# Patient Record
Sex: Male | Born: 1955 | Race: White | Hispanic: No | Marital: Married
Health system: Southern US, Community
[De-identification: ages and names within clinical notes are randomized; demographics above are authoritative.]

## PROBLEM LIST (undated history)

## (undated) DIAGNOSIS — Z6827 Body mass index (BMI) 27.0-27.9, adult: Secondary | ICD-10-CM

## (undated) DIAGNOSIS — M722 Plantar fascial fibromatosis: Secondary | ICD-10-CM

## (undated) DIAGNOSIS — E785 Hyperlipidemia, unspecified: Secondary | ICD-10-CM

## (undated) DIAGNOSIS — R7303 Prediabetes: Secondary | ICD-10-CM

## (undated) DIAGNOSIS — I4891 Unspecified atrial fibrillation: Secondary | ICD-10-CM

## (undated) DIAGNOSIS — I480 Paroxysmal atrial fibrillation: Secondary | ICD-10-CM

## (undated) HISTORY — DX: Plantar fascial fibromatosis: M72.2

## (undated) HISTORY — DX: Body mass index (BMI) 27.0-27.9, adult: Z68.27

## (undated) HISTORY — PX: BACK SURGERY: SHX140

## (undated) HISTORY — PX: SHOULDER SURGERY: SHX246

## (undated) HISTORY — DX: Prediabetes: R73.03

## (undated) HISTORY — DX: Unspecified atrial fibrillation: I48.91

## (undated) HISTORY — DX: Hyperlipidemia, unspecified: E78.5

## (undated) HISTORY — DX: Paroxysmal atrial fibrillation: I48.0

---

## 2004-10-10 ENCOUNTER — Emergency Department (HOSPITAL_COMMUNITY): Admission: EM | Admit: 2004-10-10 | Discharge: 2004-10-10 | Payer: Self-pay | Admitting: Emergency Medicine

## 2016-06-01 DIAGNOSIS — M5136 Other intervertebral disc degeneration, lumbar region: Secondary | ICD-10-CM | POA: Diagnosis not present

## 2016-06-01 DIAGNOSIS — M545 Low back pain: Secondary | ICD-10-CM | POA: Diagnosis not present

## 2017-02-26 DIAGNOSIS — J209 Acute bronchitis, unspecified: Secondary | ICD-10-CM | POA: Diagnosis not present

## 2018-05-31 DIAGNOSIS — H6122 Impacted cerumen, left ear: Secondary | ICD-10-CM | POA: Diagnosis not present

## 2018-06-08 DIAGNOSIS — N451 Epididymitis: Secondary | ICD-10-CM | POA: Diagnosis not present

## 2018-12-07 DIAGNOSIS — M25512 Pain in left shoulder: Secondary | ICD-10-CM | POA: Diagnosis not present

## 2018-12-16 DIAGNOSIS — M25512 Pain in left shoulder: Secondary | ICD-10-CM | POA: Diagnosis not present

## 2018-12-21 DIAGNOSIS — M25512 Pain in left shoulder: Secondary | ICD-10-CM | POA: Diagnosis not present

## 2019-01-13 DIAGNOSIS — Z4889 Encounter for other specified surgical aftercare: Secondary | ICD-10-CM | POA: Diagnosis not present

## 2019-01-13 DIAGNOSIS — M25512 Pain in left shoulder: Secondary | ICD-10-CM | POA: Diagnosis not present

## 2019-01-13 DIAGNOSIS — M7542 Impingement syndrome of left shoulder: Secondary | ICD-10-CM | POA: Diagnosis not present

## 2019-01-13 DIAGNOSIS — M19012 Primary osteoarthritis, left shoulder: Secondary | ICD-10-CM | POA: Diagnosis not present

## 2019-01-13 DIAGNOSIS — M659 Synovitis and tenosynovitis, unspecified: Secondary | ICD-10-CM | POA: Diagnosis not present

## 2019-01-13 DIAGNOSIS — I9789 Other postprocedural complications and disorders of the circulatory system, not elsewhere classified: Secondary | ICD-10-CM | POA: Diagnosis not present

## 2019-01-13 DIAGNOSIS — S46012A Strain of muscle(s) and tendon(s) of the rotator cuff of left shoulder, initial encounter: Secondary | ICD-10-CM | POA: Diagnosis not present

## 2019-01-13 DIAGNOSIS — S43432A Superior glenoid labrum lesion of left shoulder, initial encounter: Secondary | ICD-10-CM | POA: Diagnosis not present

## 2019-01-13 DIAGNOSIS — M94212 Chondromalacia, left shoulder: Secondary | ICD-10-CM | POA: Diagnosis not present

## 2019-01-13 DIAGNOSIS — M65812 Other synovitis and tenosynovitis, left shoulder: Secondary | ICD-10-CM | POA: Diagnosis not present

## 2019-01-20 DIAGNOSIS — M25512 Pain in left shoulder: Secondary | ICD-10-CM | POA: Diagnosis not present

## 2019-01-26 DIAGNOSIS — M25512 Pain in left shoulder: Secondary | ICD-10-CM | POA: Diagnosis not present

## 2019-02-02 DIAGNOSIS — M25512 Pain in left shoulder: Secondary | ICD-10-CM | POA: Diagnosis not present

## 2019-02-09 DIAGNOSIS — M25512 Pain in left shoulder: Secondary | ICD-10-CM | POA: Diagnosis not present

## 2019-02-17 DIAGNOSIS — M25512 Pain in left shoulder: Secondary | ICD-10-CM | POA: Diagnosis not present

## 2019-02-21 DIAGNOSIS — M25512 Pain in left shoulder: Secondary | ICD-10-CM | POA: Diagnosis not present

## 2019-02-23 DIAGNOSIS — M25512 Pain in left shoulder: Secondary | ICD-10-CM | POA: Diagnosis not present

## 2019-02-28 DIAGNOSIS — M25512 Pain in left shoulder: Secondary | ICD-10-CM | POA: Diagnosis not present

## 2019-03-01 DIAGNOSIS — D485 Neoplasm of uncertain behavior of skin: Secondary | ICD-10-CM | POA: Diagnosis not present

## 2019-03-01 DIAGNOSIS — L821 Other seborrheic keratosis: Secondary | ICD-10-CM | POA: Diagnosis not present

## 2019-03-01 DIAGNOSIS — D2239 Melanocytic nevi of other parts of face: Secondary | ICD-10-CM | POA: Diagnosis not present

## 2019-03-01 DIAGNOSIS — L820 Inflamed seborrheic keratosis: Secondary | ICD-10-CM | POA: Diagnosis not present

## 2019-03-02 DIAGNOSIS — M25512 Pain in left shoulder: Secondary | ICD-10-CM | POA: Diagnosis not present

## 2019-03-07 DIAGNOSIS — M25512 Pain in left shoulder: Secondary | ICD-10-CM | POA: Diagnosis not present

## 2019-03-14 DIAGNOSIS — M25512 Pain in left shoulder: Secondary | ICD-10-CM | POA: Diagnosis not present

## 2019-03-16 DIAGNOSIS — M25512 Pain in left shoulder: Secondary | ICD-10-CM | POA: Diagnosis not present

## 2019-03-20 DIAGNOSIS — M25512 Pain in left shoulder: Secondary | ICD-10-CM | POA: Diagnosis not present

## 2019-03-21 DIAGNOSIS — M545 Low back pain: Secondary | ICD-10-CM | POA: Diagnosis not present

## 2019-03-23 DIAGNOSIS — M25512 Pain in left shoulder: Secondary | ICD-10-CM | POA: Diagnosis not present

## 2019-03-28 DIAGNOSIS — M25512 Pain in left shoulder: Secondary | ICD-10-CM | POA: Diagnosis not present

## 2019-03-29 DIAGNOSIS — M545 Low back pain: Secondary | ICD-10-CM | POA: Diagnosis not present

## 2019-03-30 DIAGNOSIS — M25512 Pain in left shoulder: Secondary | ICD-10-CM | POA: Diagnosis not present

## 2019-04-04 DIAGNOSIS — M25512 Pain in left shoulder: Secondary | ICD-10-CM | POA: Diagnosis not present

## 2019-04-11 DIAGNOSIS — M5136 Other intervertebral disc degeneration, lumbar region: Secondary | ICD-10-CM | POA: Diagnosis not present

## 2019-04-30 DIAGNOSIS — Z20828 Contact with and (suspected) exposure to other viral communicable diseases: Secondary | ICD-10-CM | POA: Diagnosis not present

## 2019-06-06 DIAGNOSIS — M79671 Pain in right foot: Secondary | ICD-10-CM | POA: Diagnosis not present

## 2019-06-06 DIAGNOSIS — M25562 Pain in left knee: Secondary | ICD-10-CM | POA: Diagnosis not present

## 2019-10-05 DIAGNOSIS — M25562 Pain in left knee: Secondary | ICD-10-CM | POA: Diagnosis not present

## 2019-10-19 DIAGNOSIS — M25562 Pain in left knee: Secondary | ICD-10-CM | POA: Diagnosis not present

## 2019-11-03 DIAGNOSIS — M25562 Pain in left knee: Secondary | ICD-10-CM | POA: Diagnosis not present

## 2019-11-21 DIAGNOSIS — G8918 Other acute postprocedural pain: Secondary | ICD-10-CM | POA: Diagnosis not present

## 2019-11-21 DIAGNOSIS — M23322 Other meniscus derangements, posterior horn of medial meniscus, left knee: Secondary | ICD-10-CM | POA: Diagnosis not present

## 2020-01-07 DIAGNOSIS — R109 Unspecified abdominal pain: Secondary | ICD-10-CM | POA: Diagnosis not present

## 2020-01-07 DIAGNOSIS — R197 Diarrhea, unspecified: Secondary | ICD-10-CM | POA: Diagnosis not present

## 2020-01-17 ENCOUNTER — Other Ambulatory Visit: Payer: Self-pay | Admitting: Family Medicine

## 2020-01-17 DIAGNOSIS — R109 Unspecified abdominal pain: Secondary | ICD-10-CM

## 2020-01-23 ENCOUNTER — Ambulatory Visit
Admission: RE | Admit: 2020-01-23 | Discharge: 2020-01-23 | Disposition: A | Discharge status: Home / Self Care | Payer: BC Managed Care – PPO | Source: Ambulatory Visit | Attending: Family Medicine | Admitting: Family Medicine

## 2020-01-23 ENCOUNTER — Other Ambulatory Visit: Payer: Self-pay

## 2020-01-23 DIAGNOSIS — R109 Unspecified abdominal pain: Secondary | ICD-10-CM

## 2020-05-15 DIAGNOSIS — R42 Dizziness and giddiness: Secondary | ICD-10-CM | POA: Diagnosis not present

## 2020-08-16 DIAGNOSIS — E782 Mixed hyperlipidemia: Secondary | ICD-10-CM | POA: Diagnosis not present

## 2020-08-16 DIAGNOSIS — R7989 Other specified abnormal findings of blood chemistry: Secondary | ICD-10-CM | POA: Diagnosis not present

## 2020-08-16 DIAGNOSIS — Z0000 Encounter for general adult medical examination without abnormal findings: Secondary | ICD-10-CM | POA: Diagnosis not present

## 2020-08-16 DIAGNOSIS — R739 Hyperglycemia, unspecified: Secondary | ICD-10-CM | POA: Diagnosis not present

## 2020-08-16 DIAGNOSIS — Z125 Encounter for screening for malignant neoplasm of prostate: Secondary | ICD-10-CM | POA: Diagnosis not present

## 2020-10-09 DIAGNOSIS — R55 Syncope and collapse: Secondary | ICD-10-CM | POA: Insufficient documentation

## 2020-10-09 DIAGNOSIS — R42 Dizziness and giddiness: Secondary | ICD-10-CM | POA: Diagnosis not present

## 2020-10-09 DIAGNOSIS — R002 Palpitations: Secondary | ICD-10-CM | POA: Insufficient documentation

## 2020-10-09 DIAGNOSIS — R0602 Shortness of breath: Secondary | ICD-10-CM | POA: Diagnosis not present

## 2020-10-09 NOTE — Progress Notes (Deleted)
    Cardiology Office Note   Date:  10/09/2020   ID:  Marcus Salazar, DOB [birth year], MRN [id number]  PCP:  Marcus Gosselin, MD  Cardiologist:   None Referring:  ***  No chief complaint on file.     History of Present Illness: Marcus Salazar is a 65 y.o. male who was an add on to my schedule today because of near syncope, SOB and palpitations.  ***   No past medical history on file.  *** The histories are not reviewed yet. Please review them in the "History" navigator section and refresh this SmartLink.   No current outpatient medications on file.   No current facility-administered medications for this visit.    Allergies:   Patient has no allergy information on record.    Social History:  The patient     Family History:  The patient's ***family history is not on file.    ROS:  Please see the history of present illness.   Otherwise, review of systems are positive for {NONE DEFAULTED:18576}.   All other systems are reviewed and negative.    PHYSICAL EXAM: VS:  There were no vitals taken for this visit. , BMI There is no height or weight on file to calculate BMI. GENERAL:  Well appearing HEENT:  Pupils equal round and reactive, fundi not visualized, oral mucosa unremarkable NECK:  No jugular venous distention, waveform within normal limits, carotid upstroke brisk and symmetric, no bruits, no thyromegaly LYMPHATICS:  No cervical, inguinal adenopathy LUNGS:  Clear to auscultation bilaterally BACK:  No CVA tenderness CHEST:  Unremarkable HEART:  PMI not displaced or sustained,S1 and S2 within normal limits, no S3, no S4, no clicks, no rubs, *** murmurs ABD:  Flat, positive bowel sounds normal in frequency in pitch, no bruits, no rebound, no guarding, no midline pulsatile mass, no hepatomegaly, no splenomegaly EXT:  2 plus pulses throughout, no edema, no cyanosis no clubbing SKIN:  No rashes no nodules NEURO:  Cranial nerves II through XII grossly intact, motor grossly intact  throughout PSYCH:  Cognitively intact, oriented to person place and time    EKG:  EKG {ACTION; IS/IS RDE:08144818} ordered today. The ekg ordered today demonstrates ***   Recent Labs: No results found for requested labs within last 8760 hours.    Lipid Panel No results found for: CHOL, TRIG, HDL, CHOLHDL, VLDL, LDLCALC, LDLDIRECT    Wt Readings from Last 3 Encounters:  No data found for Wt      Other studies Reviewed: Additional studies/ records that were reviewed today include: ***. Review of the above records demonstrates:  Please see elsewhere in the note.  ***   ASSESSMENT AND PLAN:  PALPITATIONS:  ***  NEAR SYNCOPE:  ***  SOB:  ***    Current medicines are reviewed at length with the patient today.  The patient {ACTIONS; HAS/DOES NOT HAVE:19233} concerns regarding medicines.  The following changes have been made:  {PLAN; NO CHANGE:13088:s}  Labs/ tests ordered today include: *** No orders of the defined types were placed in this encounter.    Disposition:   FU with ***    Signed, Rollene Rotunda, MD  10/09/2020 6:24 PM    [facility]

## 2020-10-10 ENCOUNTER — Ambulatory Visit: Payer: BC Managed Care – PPO | Admitting: Cardiology

## 2020-10-11 DIAGNOSIS — R509 Fever, unspecified: Secondary | ICD-10-CM | POA: Diagnosis not present

## 2020-10-11 DIAGNOSIS — R051 Acute cough: Secondary | ICD-10-CM | POA: Diagnosis not present

## 2020-10-11 DIAGNOSIS — J029 Acute pharyngitis, unspecified: Secondary | ICD-10-CM | POA: Diagnosis not present

## 2020-10-11 DIAGNOSIS — U071 COVID-19: Secondary | ICD-10-CM | POA: Diagnosis not present

## 2020-12-25 ENCOUNTER — Ambulatory Visit: Payer: BC Managed Care – PPO | Admitting: Cardiology

## 2021-10-15 DIAGNOSIS — Z0000 Encounter for general adult medical examination without abnormal findings: Secondary | ICD-10-CM | POA: Diagnosis not present

## 2021-10-15 DIAGNOSIS — Z2821 Immunization not carried out because of patient refusal: Secondary | ICD-10-CM | POA: Diagnosis not present

## 2021-10-15 DIAGNOSIS — E782 Mixed hyperlipidemia: Secondary | ICD-10-CM | POA: Diagnosis not present

## 2021-10-15 DIAGNOSIS — Z136 Encounter for screening for cardiovascular disorders: Secondary | ICD-10-CM | POA: Diagnosis not present

## 2021-11-02 DIAGNOSIS — E860 Dehydration: Secondary | ICD-10-CM | POA: Diagnosis not present

## 2021-11-02 DIAGNOSIS — K7689 Other specified diseases of liver: Secondary | ICD-10-CM | POA: Diagnosis not present

## 2021-11-02 DIAGNOSIS — K859 Acute pancreatitis without necrosis or infection, unspecified: Secondary | ICD-10-CM | POA: Diagnosis not present

## 2021-11-02 DIAGNOSIS — Z8507 Personal history of malignant neoplasm of pancreas: Secondary | ICD-10-CM | POA: Diagnosis not present

## 2021-11-02 DIAGNOSIS — N179 Acute kidney failure, unspecified: Secondary | ICD-10-CM | POA: Diagnosis not present

## 2021-11-02 DIAGNOSIS — R1013 Epigastric pain: Secondary | ICD-10-CM | POA: Diagnosis not present

## 2021-11-02 DIAGNOSIS — K5900 Constipation, unspecified: Secondary | ICD-10-CM | POA: Diagnosis not present

## 2021-11-02 DIAGNOSIS — Z800 Family history of malignant neoplasm of digestive organs: Secondary | ICD-10-CM | POA: Diagnosis not present

## 2021-11-02 DIAGNOSIS — N2889 Other specified disorders of kidney and ureter: Secondary | ICD-10-CM | POA: Diagnosis not present

## 2021-11-02 DIAGNOSIS — K769 Liver disease, unspecified: Secondary | ICD-10-CM | POA: Diagnosis not present

## 2021-11-02 DIAGNOSIS — N281 Cyst of kidney, acquired: Secondary | ICD-10-CM | POA: Diagnosis not present

## 2021-11-02 DIAGNOSIS — N170 Acute kidney failure with tubular necrosis: Secondary | ICD-10-CM | POA: Diagnosis not present

## 2021-11-02 DIAGNOSIS — K5730 Diverticulosis of large intestine without perforation or abscess without bleeding: Secondary | ICD-10-CM | POA: Diagnosis not present

## 2021-11-02 DIAGNOSIS — R7303 Prediabetes: Secondary | ICD-10-CM | POA: Diagnosis not present

## 2021-11-02 DIAGNOSIS — R7989 Other specified abnormal findings of blood chemistry: Secondary | ICD-10-CM | POA: Diagnosis not present

## 2022-01-10 IMAGING — US US ABDOMEN LIMITED
1 series · 10 of 10 positions shown · non-contrast
Comparison: None.

CLINICAL DATA: Abdominal wall pain. Rule out ventral hernia. Left
periumbilical pain for 5 weeks.

EXAM:
ULTRASOUND ABDOMEN LIMITED

[Series 1: us abdomen limited · 10 acquisitions, 10 frames shown]
[im 1/10]
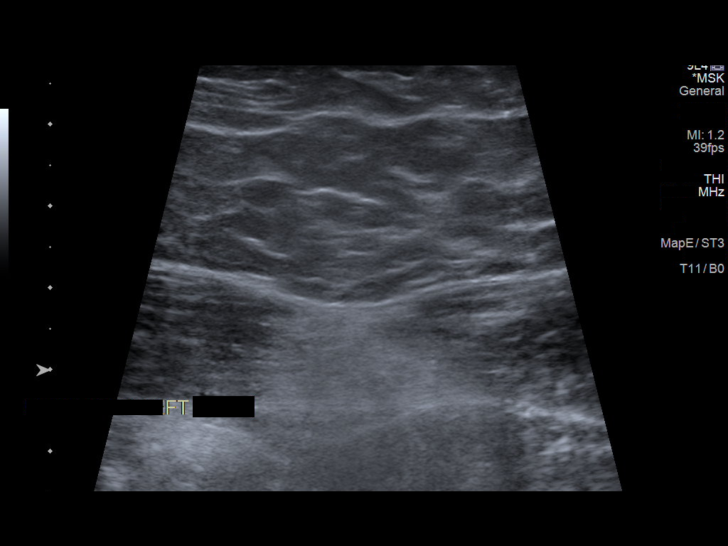
[im 2/10]
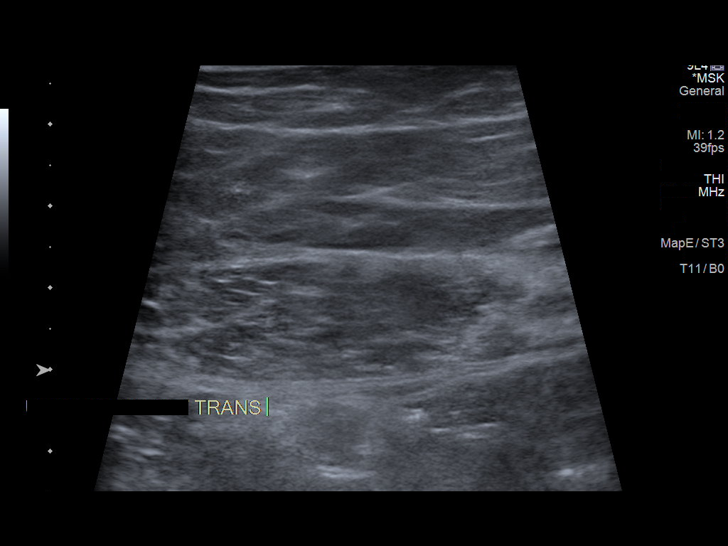
[im 3/10]
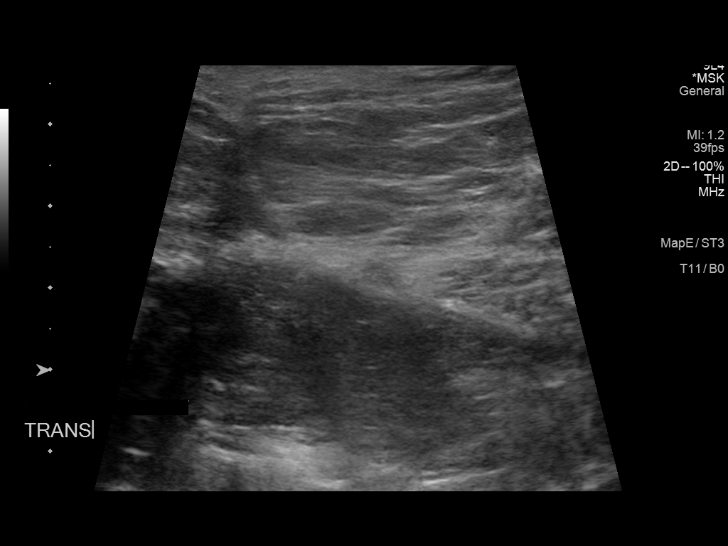
[im 4/10]
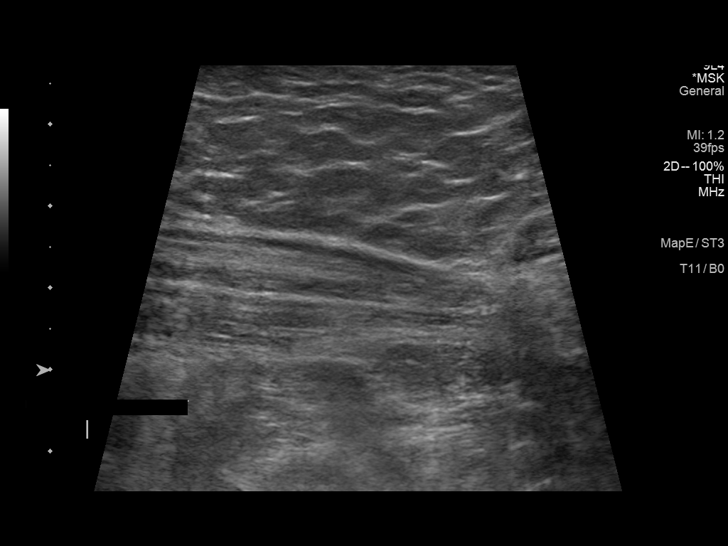
[im 5/10]
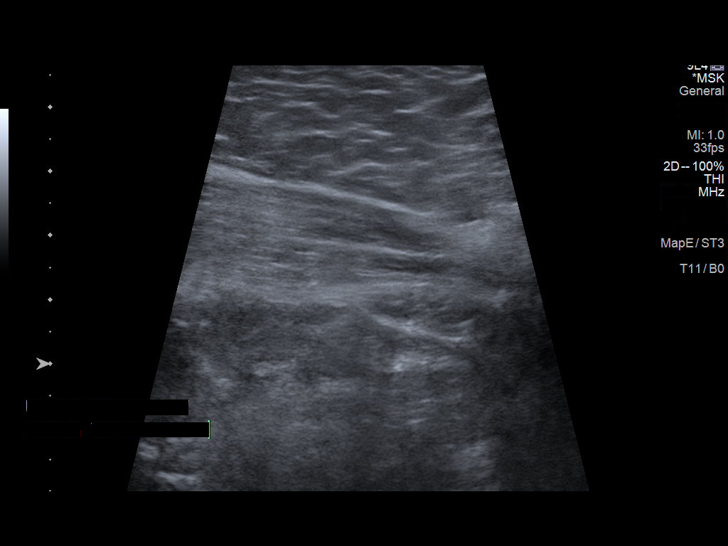
[im 6/10]
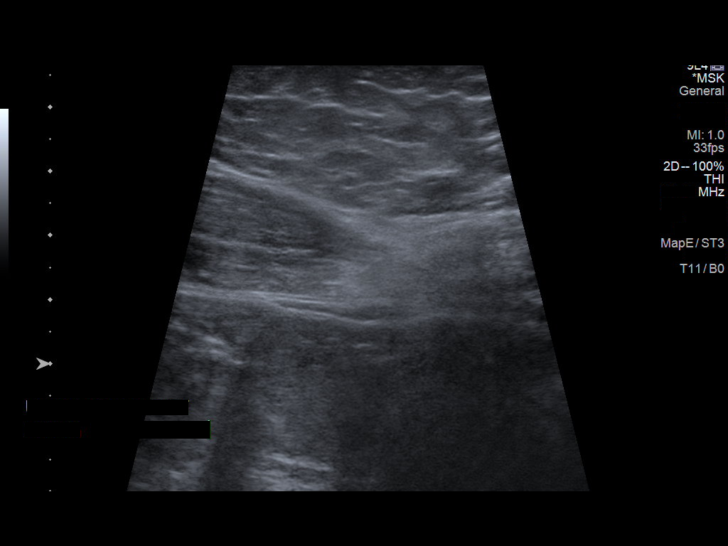
[im 7/10]
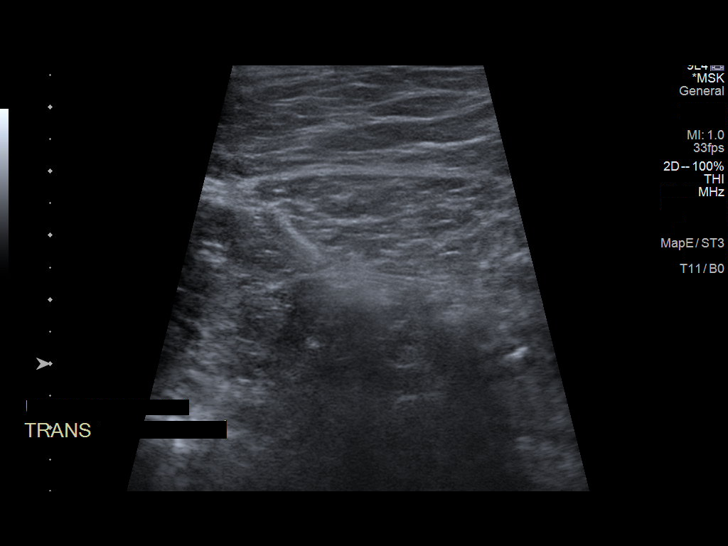
[im 8/10]
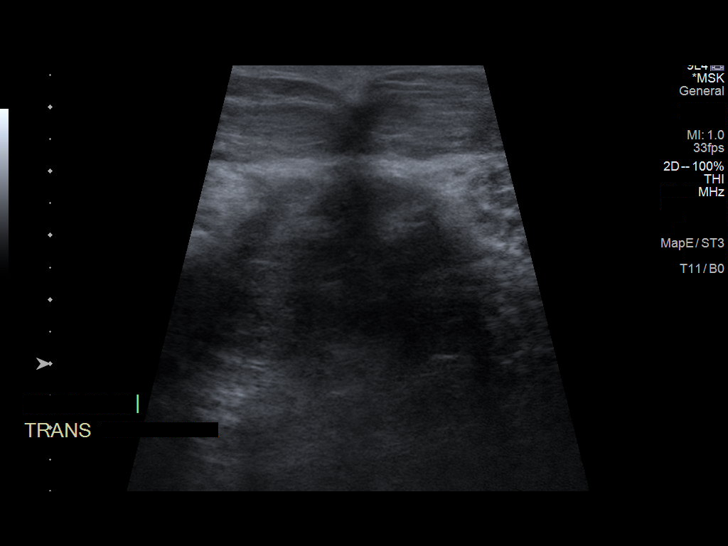
[im 9/10]
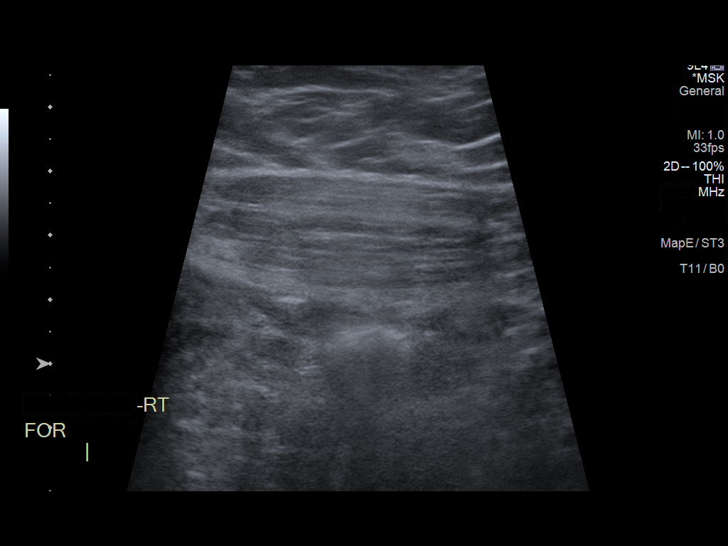
[im 10/10]
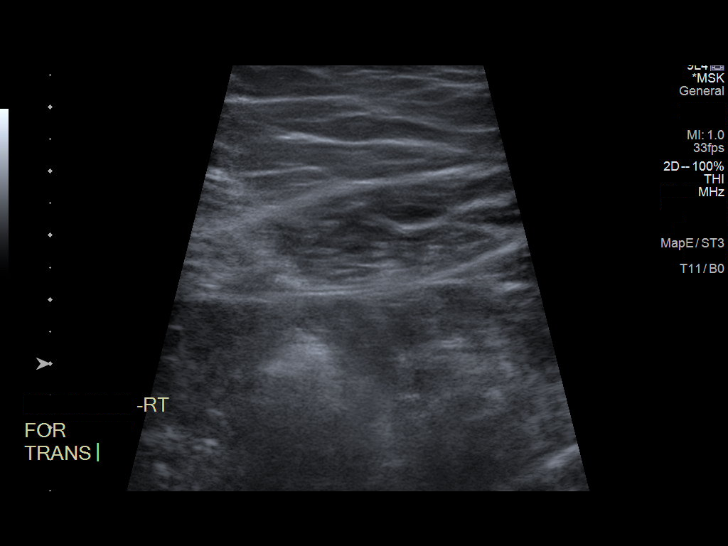

[10 of 10 positions shown; findings below may reference images not displayed]

FINDINGS: Targeted sonographic evaluation of the anterior abdominal wall. No
evidence of abdominal wall defect or hernia. Homogeneous appearance
of the subcutaneous soft tissues and visualized musculature. No
evidence of focal mass or fluid collection.
IMPRESSION: No sonographic evidence of abdominal wall hernia. No focal
abnormality in the area of clinical concern in the anterior
abdominal wall.

## 2022-03-11 DIAGNOSIS — M25561 Pain in right knee: Secondary | ICD-10-CM | POA: Diagnosis not present

## 2022-03-29 DIAGNOSIS — M25561 Pain in right knee: Secondary | ICD-10-CM | POA: Diagnosis not present

## 2022-04-01 DIAGNOSIS — J069 Acute upper respiratory infection, unspecified: Secondary | ICD-10-CM | POA: Diagnosis not present

## 2022-04-01 DIAGNOSIS — Z03818 Encounter for observation for suspected exposure to other biological agents ruled out: Secondary | ICD-10-CM | POA: Diagnosis not present

## 2022-04-17 DIAGNOSIS — S83241D Other tear of medial meniscus, current injury, right knee, subsequent encounter: Secondary | ICD-10-CM | POA: Diagnosis not present

## 2022-07-28 DIAGNOSIS — M722 Plantar fascial fibromatosis: Secondary | ICD-10-CM | POA: Diagnosis not present

## 2022-07-28 DIAGNOSIS — M25571 Pain in right ankle and joints of right foot: Secondary | ICD-10-CM | POA: Diagnosis not present

## 2022-09-28 DIAGNOSIS — M064 Inflammatory polyarthropathy: Secondary | ICD-10-CM | POA: Diagnosis not present

## 2022-09-28 DIAGNOSIS — E782 Mixed hyperlipidemia: Secondary | ICD-10-CM | POA: Diagnosis not present

## 2022-09-28 DIAGNOSIS — M25571 Pain in right ankle and joints of right foot: Secondary | ICD-10-CM | POA: Diagnosis not present

## 2022-09-28 DIAGNOSIS — R7303 Prediabetes: Secondary | ICD-10-CM | POA: Diagnosis not present

## 2022-10-06 DIAGNOSIS — M722 Plantar fascial fibromatosis: Secondary | ICD-10-CM | POA: Diagnosis not present

## 2022-10-12 DIAGNOSIS — M79671 Pain in right foot: Secondary | ICD-10-CM | POA: Diagnosis not present

## 2022-10-26 DIAGNOSIS — I480 Paroxysmal atrial fibrillation: Secondary | ICD-10-CM | POA: Diagnosis not present

## 2022-10-26 DIAGNOSIS — Z0000 Encounter for general adult medical examination without abnormal findings: Secondary | ICD-10-CM | POA: Diagnosis not present

## 2022-11-24 DIAGNOSIS — M23221 Derangement of posterior horn of medial meniscus due to old tear or injury, right knee: Secondary | ICD-10-CM | POA: Diagnosis not present

## 2022-11-24 DIAGNOSIS — G8918 Other acute postprocedural pain: Secondary | ICD-10-CM | POA: Diagnosis not present

## 2022-11-24 DIAGNOSIS — M23321 Other meniscus derangements, posterior horn of medial meniscus, right knee: Secondary | ICD-10-CM | POA: Diagnosis not present

## 2023-02-08 DIAGNOSIS — L821 Other seborrheic keratosis: Secondary | ICD-10-CM | POA: Diagnosis not present

## 2023-02-08 DIAGNOSIS — L820 Inflamed seborrheic keratosis: Secondary | ICD-10-CM | POA: Diagnosis not present

## 2023-02-08 DIAGNOSIS — L814 Other melanin hyperpigmentation: Secondary | ICD-10-CM | POA: Diagnosis not present

## 2023-02-08 DIAGNOSIS — L570 Actinic keratosis: Secondary | ICD-10-CM | POA: Diagnosis not present

## 2023-03-18 ENCOUNTER — Encounter: Payer: Self-pay | Admitting: Cardiology

## 2023-03-18 ENCOUNTER — Ambulatory Visit: Payer: Medicare Other | Attending: Cardiology | Admitting: Cardiology

## 2023-03-18 VITALS — BP 132/92 | HR 95 | Ht 71.0 in | Wt 195.8 lb

## 2023-03-18 DIAGNOSIS — R55 Syncope and collapse: Secondary | ICD-10-CM

## 2023-03-18 DIAGNOSIS — R42 Dizziness and giddiness: Secondary | ICD-10-CM | POA: Diagnosis not present

## 2023-03-18 DIAGNOSIS — R0602 Shortness of breath: Secondary | ICD-10-CM | POA: Diagnosis not present

## 2023-03-18 DIAGNOSIS — R072 Precordial pain: Secondary | ICD-10-CM | POA: Diagnosis not present

## 2023-03-18 MED ORDER — METOPROLOL TARTRATE 100 MG PO TABS: 100.0000 mg | ORAL_TABLET | Freq: Once | ORAL | 0 refills | Status: AC

## 2023-03-18 NOTE — Patient Instructions (Addendum)
Medication Instructions:  Your physician recommends that you continue on your current medications as directed. Please refer to the Current Medication list given to you today.  *If you need a refill on your cardiac medications before your next appointment, please call your pharmacy*  Lab Work: None ordered today.  Testing/Procedures: Your physician has requested that you have an echocardiogram. Echocardiography is a painless test that uses sound waves to create images of your heart. It provides your doctor with information about the size and shape of your heart and how well your heart's chambers and valves are working. This procedure takes approximately one hour. There are no restrictions for this procedure. Please do NOT wear cologne, perfume, aftershave, or lotions (deodorant is allowed). Please arrive 15 minutes prior to your appointment time.  Please note: We ask at that you not bring children with you during ultrasound (echo/ vascular) testing. Due to room size and safety concerns, children are not allowed in the ultrasound rooms during exams. Our front office staff cannot provide observation of children in our lobby area while testing is being conducted. An adult accompanying a patient to their appointment will only be allowed in the ultrasound room at the discretion of the ultrasound technician under special circumstances. We apologize for any inconvenience.  Your physician has requested that you have cardiac CT. Cardiac computed tomography (CT) is a painless test that uses an x-ray machine to take clear, detailed pictures of your heart. For further information please visit [email or web address] Please follow instruction sheet as given.    Follow-Up: At [facility], you and your health needs are our priority.  As part of our continuing mission to provide you with exceptional heart care, we have created designated Provider Care Teams.  These Care Teams include your primary Cardiologist  (physician) and Advanced Practice Providers (APPs -  Physician Assistants and Nurse Practitioners) who all work together to provide you with the care you need, when you need it.  We recommend signing up for the patient portal called "MyChart".  Sign up information is provided on this After Visit Summary.  MyChart is used to connect with patients for Virtual Visits (Telemedicine).  Patients are able to view lab/test results, encounter notes, upcoming appointments, etc.  Non-urgent messages can be sent to your provider as well.   To learn more about what you can do with MyChart, go to [email or web address].    Your next appointment:   As needed  The format for your next appointment:   In Person  Provider:   Donato Schultz, MD {  Other Instructions   Your cardiac CT will be scheduled at:   [facility] [address] [place], [place] [address] [phone]  If scheduled at [facility], please arrive at the [facility] and Children's Entrance (Entrance C2) of [facility] 30 minutes prior to test start time.  You can use the FREE valet parking offered at entrance C (encouraged to control the heart rate for the test).  Proceed to the [facility] Radiology Department (first floor) to check-in and test prep.  All radiology patients and guests should use entrance C2 at [facility], accessed from [facility], even though the hospital's physical address listed is [address].    Please follow these instructions carefully (unless otherwise directed):  An IV will be required for this test and Nitroglycerin will be given.  Hold all erectile dysfunction medications at least 3 days (72 hrs) prior to test. (Ie viagra,  cialis, sildenafil, tadalafil, etc)   On the Night Before the Test: Be sure to Drink plenty of water. Do not consume any caffeinated/decaffeinated beverages or chocolate 12 hours prior to your test. Do not take any  antihistamines 12 hours prior to your test.  On the Day of the Test: Drink plenty of water until 1 hour prior to the test. Do not eat any food 1 hour prior to test. You may take your regular medications prior to the test.  Take metoprolol (Lopressor) 100 mg two hours prior to test.  After the Test: Drink plenty of water. After receiving IV contrast, you may experience a mild flushed feeling. This is normal. On occasion, you may experience a mild rash up to 24 hours after the test. This is not dangerous. If this occurs, you can take Benadryl 25 mg and increase your fluid intake. If you experience trouble breathing, this can be serious. If it is severe call 911 IMMEDIATELY. If it is mild, please call our office.  We will call to schedule your test 2-4 weeks out understanding that some insurance companies will need an authorization prior to the service being performed.   For more information and frequently asked questions, please visit our website : [email or web address]  For non-scheduling related questions, please contact the cardiac imaging nurse navigator should you have any questions/concerns: Cardiac Imaging Nurse Navigators Direct Office Dial: [phone]   For scheduling needs, including cancellations and rescheduling, please call [place], [phone].

## 2023-03-18 NOTE — Progress Notes (Signed)
Cardiology Office Note:  .   Date:  03/18/2023  ID:  Marcus Salazar, DOB [birth year], MRN [id number] PCP: Catha Gosselin, MD  [facility] Providers Cardiologist:  Donato Schultz, MD     History of Present Illness: .   Marcus Salazar is a 67 y.o. male Discussed with the use of AI scribe   History of Present Illness   The patient, a 67 year old male with a history of atrial fibrillation (years ago, Dr. Donnie Aho), presents with intermittent episodes of dizziness and shortness of breath that have been occurring for a couple of years. These episodes are often precipitated by physical exertion, specifically after completing a set of exercises at the gym. The patient describes the sensation as feeling like he is about to pass out, often occurring when he is seated. He also reports feeling 'washed out' and 'starry-eyed' after these episodes.  Interestingly, the patient has noticed a correlation between the consumption of sweet tea and the onset of these symptoms. He has been trying to reduce his intake of sweet tea, suspecting that the sugar or caffeine might be contributing to his symptoms.  The patient was diagnosed with atrial fibrillation several years ago during a pre-operative evaluation for shoulder surgery. At that time, he was advised to take beta blockers, but a second opinion from another cardiologist suggested that they were not necessary. The patient has been managing his condition with aspirin since then.  The patient denies any chest discomfort, heaviness, or tightness during these episodes. He has been trying to manage his symptoms by hydrating well before exercise sessions and focusing on his breathing during workouts. He has also considered reducing the intensity of his workouts.            Studies Reviewed: Marland Kitchen   EKG Interpretation Date/Time:  Thursday March 18 2023 13:52:42 EST Ventricular Rate:  65 PR Interval:  234 QRS Duration:  84 QT Interval:  408 QTC  Calculation: 424 R Axis:   71  Text Interpretation: Sinus rhythm with sinus arrhythmia with 1st degree A-V block When compared with ECG of 10-Oct-2004 09:02, PR interval has increased Confirmed by Donato Schultz ([id number]) on 03/18/2023 1:59:12 PM    Results   DIAGNOSTIC EKG: Atrial fibrillation (2008) Echocardiogram: Normal (2008)     Risk Assessment/Calculations:           Physical Exam:   VS:  BP (!) 132/92   Pulse 95   Ht 5\' 11"  (1.803 m)   Wt 195 lb 12.8 oz (88.8 kg)   SpO2 95%   BMI 27.31 kg/m    Wt Readings from Last 3 Encounters:  03/18/23 195 lb 12.8 oz (88.8 kg)    GEN: Well nourished, well developed in no acute distress NECK: No JVD; No carotid bruits CARDIAC: RRR, no murmurs, no rubs, no gallops RESPIRATORY:  Clear to auscultation without rales, wheezing or rhonchi  ABDOMEN: Soft, non-tender, non-distended EXTREMITIES:  No edema; No deformity   ASSESSMENT AND PLAN: .    Assessment and Plan    Near Syncope Intermittent episodes of dizziness and near syncope, particularly post-exercise and after consuming sweet tea. Symptoms have been ongoing for a couple of years but have become more noticeable recently. Possible contributing factors include vasodilatation post-exercise, rapid changes in blood pressure, and potential hyperventilation. Differential diagnosis includes cardiac causes such as arrhythmias or coronary artery disease. Discussed coronary CT scan as a non-invasive method to visualize coronary arteries and detect blockages, with a quick procedure time of 25-30  seconds. Explained that if a significant blockage is found, treatment might involve a minimally invasive procedure through the wrist artery to place a stent, avoiding major surgery. Echocardiogram discussed as a complementary test to assess heart function and valve status. - Order echocardiogram - Order coronary CT scan - Advise hydration before exercise - Recommend reducing intensity and frequency of  weightlifting sets - Monitor breathing techniques during exercise  Atrial Fibrillation (AFib) Diagnosed in 2008. No current symptoms suggestive of AFib recurrence. Previous management included aspirin, no beta blockers as per second opinion. Discussed the option of using an ambulatory cardiac monitor if symptoms persist or worsen to capture any potential arrhythmias. - Consider ambulatory cardiac monitor if symptoms persist or worsen  General Health Maintenance Non-smoker and non-drinker. Family history includes mother with no heart issues and father who died of pancreatic cancer in his sixties. Attempting to reduce sweet tea consumption and increase water intake. - Encourage continued reduction of sweet tea intake - Promote increased water consumption  Follow-up - Schedule echocardiogram and coronary CT scan - Follow-up with cardiologist after test results - Discuss potential use of ambulatory cardiac monitor if needed.              Signed, Donato Schultz, MD

## 2023-03-22 ENCOUNTER — Ambulatory Visit (HOSPITAL_COMMUNITY): Payer: Medicare Other | Attending: Cardiology

## 2023-03-22 DIAGNOSIS — R55 Syncope and collapse: Secondary | ICD-10-CM | POA: Diagnosis not present

## 2023-03-22 LAB — ECHOCARDIOGRAM COMPLETE
Area-P 1/2: 3.54 cm2
S' Lateral: 3.4 cm

## 2023-03-31 ENCOUNTER — Telehealth (HOSPITAL_COMMUNITY): Payer: Self-pay | Admitting: *Deleted

## 2023-03-31 NOTE — Telephone Encounter (Signed)
Patient returning call about upcoming cardiac imaging study; pt verbalizes understanding of appt date/time, parking situation and where to check in, pre-test NPO status and medications ordered, and verified current allergies; name and call back number provided for further questions should they arise  Larey Brick RN Navigator Cardiac Imaging [facility] [phone] office [phone] cell  Patient to take 50mg  metoprolol tartrate two hours prior to his cardiac CT scan. He is aware to arrive at 8 AM.

## 2023-04-01 ENCOUNTER — Ambulatory Visit (HOSPITAL_COMMUNITY)
Admission: RE | Admit: 2023-04-01 | Discharge: 2023-04-01 | Disposition: A | Discharge status: Home / Self Care | Payer: Medicare Other | Source: Ambulatory Visit | Attending: Cardiology | Admitting: Cardiology

## 2023-04-01 DIAGNOSIS — R072 Precordial pain: Secondary | ICD-10-CM | POA: Diagnosis not present

## 2023-04-01 DIAGNOSIS — R55 Syncope and collapse: Secondary | ICD-10-CM | POA: Insufficient documentation

## 2023-04-01 MED ORDER — SODIUM CHLORIDE 0.9 % IV BOLUS
250.0000 mL | Freq: Once | INTRAVENOUS | Status: AC
  Administered 2023-04-01: 250 mL via INTRAVENOUS

## 2023-04-01 MED ORDER — NITROGLYCERIN 0.4 MG SL SUBL
SUBLINGUAL_TABLET | SUBLINGUAL | Status: AC
  Filled 2023-04-01: qty 2

## 2023-04-01 MED ORDER — IOHEXOL 350 MG/ML SOLN
95.0000 mL | Freq: Once | INTRAVENOUS | Status: AC | PRN
  Administered 2023-04-01: 95 mL via INTRAVENOUS

## 2023-04-01 MED ORDER — NITROGLYCERIN 0.4 MG SL SUBL
0.8000 mg | SUBLINGUAL_TABLET | SUBLINGUAL | Status: DC | PRN
  Administered 2023-04-01: 0.8 mg via SUBLINGUAL

## 2023-04-01 MED ORDER — SODIUM CHLORIDE 0.9 % IV BOLUS
500.0000 mL | Freq: Once | INTRAVENOUS | Status: AC
  Administered 2023-04-01: 500 mL via INTRAVENOUS

## 2023-04-01 NOTE — Progress Notes (Signed)
Dr. Cristal Deer notified pt remains hypotensive and bradycardic (84/70, HR 47) after 1L fluid bolus; pt remains asymptomatic and states he is "feeling better"(pt did not voice and complaints prior); Per Dr. Cristal Deer, pt to go home and lie down  Pt ambulatory with steady gait, continues to deny dizziness/lightheadedness; pt reminded to lie down once home, hydrate, and to notify MD if begins to feel dizzy or lightheaded; pt verbalized understanding of discharge instructions; pt's wife in waiting room, will drive pt home

## 2023-04-01 NOTE — Progress Notes (Signed)
Pt drinking water; 250 bolus started; pt has no complaints at this time

## 2023-04-01 NOTE — Progress Notes (Signed)
Dr. Cristal Deer notified of continues hypotension and bradycardia; pt remains asymptomatic; per Dr. Cristal Deer, okay to discharge pt once systolic 90; verbal order to hang another 500 bolus if needed, encourage oral hydration; once discharged pt to hydrate at home, lie down; pt to contact MD if he begins to feel lightheaded or dizzy; pt verbalized understanding of instructions

## 2023-05-12 DIAGNOSIS — D235 Other benign neoplasm of skin of trunk: Secondary | ICD-10-CM | POA: Diagnosis not present

## 2023-05-12 DIAGNOSIS — L821 Other seborrheic keratosis: Secondary | ICD-10-CM | POA: Diagnosis not present

## 2023-05-12 DIAGNOSIS — D485 Neoplasm of uncertain behavior of skin: Secondary | ICD-10-CM | POA: Diagnosis not present

## 2023-05-12 DIAGNOSIS — I781 Nevus, non-neoplastic: Secondary | ICD-10-CM | POA: Diagnosis not present

## 2023-05-12 DIAGNOSIS — L820 Inflamed seborrheic keratosis: Secondary | ICD-10-CM | POA: Diagnosis not present

## 2023-06-17 DIAGNOSIS — Z6826 Body mass index (BMI) 26.0-26.9, adult: Secondary | ICD-10-CM | POA: Diagnosis not present

## 2023-06-17 DIAGNOSIS — Z008 Encounter for other general examination: Secondary | ICD-10-CM | POA: Diagnosis not present

## 2023-06-17 DIAGNOSIS — E663 Overweight: Secondary | ICD-10-CM | POA: Diagnosis not present

## 2023-06-17 DIAGNOSIS — E785 Hyperlipidemia, unspecified: Secondary | ICD-10-CM | POA: Diagnosis not present

## 2023-06-17 DIAGNOSIS — I4891 Unspecified atrial fibrillation: Secondary | ICD-10-CM | POA: Diagnosis not present

## 2024-01-14 DIAGNOSIS — Z9889 Other specified postprocedural states: Secondary | ICD-10-CM | POA: Diagnosis not present

## 2024-01-14 DIAGNOSIS — M25561 Pain in right knee: Secondary | ICD-10-CM | POA: Diagnosis not present

## 2024-01-30 DIAGNOSIS — M25561 Pain in right knee: Secondary | ICD-10-CM | POA: Diagnosis not present
